# Patient Record
Sex: Female | Born: 1990 | Hispanic: No | Marital: Single | Smoking: Never smoker
Health system: Southern US, Community
[De-identification: ages and names within clinical notes are randomized; demographics above are authoritative.]

---

## 2014-07-23 ENCOUNTER — Emergency Department (HOSPITAL_COMMUNITY)
Admission: EM | Admit: 2014-07-23 | Discharge: 2014-07-24 | Disposition: A | Discharge status: Home / Self Care | Payer: No Typology Code available for payment source | Attending: Emergency Medicine | Admitting: Emergency Medicine

## 2014-07-23 ENCOUNTER — Encounter (HOSPITAL_COMMUNITY): Payer: Self-pay | Admitting: Emergency Medicine

## 2014-07-23 ENCOUNTER — Emergency Department (HOSPITAL_COMMUNITY): Payer: No Typology Code available for payment source

## 2014-07-23 DIAGNOSIS — S299XXA Unspecified injury of thorax, initial encounter: Secondary | ICD-10-CM | POA: Insufficient documentation

## 2014-07-23 DIAGNOSIS — S3991XA Unspecified injury of abdomen, initial encounter: Secondary | ICD-10-CM | POA: Diagnosis present

## 2014-07-23 DIAGNOSIS — S2001XA Contusion of right breast, initial encounter: Secondary | ICD-10-CM | POA: Diagnosis not present

## 2014-07-23 DIAGNOSIS — Y998 Other external cause status: Secondary | ICD-10-CM | POA: Insufficient documentation

## 2014-07-23 DIAGNOSIS — Y9389 Activity, other specified: Secondary | ICD-10-CM | POA: Insufficient documentation

## 2014-07-23 DIAGNOSIS — S30811A Abrasion of abdominal wall, initial encounter: Secondary | ICD-10-CM | POA: Diagnosis not present

## 2014-07-23 DIAGNOSIS — S301XXA Contusion of abdominal wall, initial encounter: Secondary | ICD-10-CM | POA: Insufficient documentation

## 2014-07-23 DIAGNOSIS — Y92410 Unspecified street and highway as the place of occurrence of the external cause: Secondary | ICD-10-CM | POA: Insufficient documentation

## 2014-07-23 LAB — URINE MICROSCOPIC-ADD ON

## 2014-07-23 LAB — URINALYSIS, ROUTINE W REFLEX MICROSCOPIC
BILIRUBIN URINE: NEGATIVE
Glucose, UA: NEGATIVE mg/dL
KETONES UR: NEGATIVE mg/dL
Leukocytes, UA: NEGATIVE
NITRITE: NEGATIVE
Protein, ur: NEGATIVE mg/dL
Specific Gravity, Urine: 1.007 (ref 1.005–1.030)
Urobilinogen, UA: 0.2 mg/dL (ref 0.0–1.0)
pH: 6.5 (ref 5.0–8.0)

## 2014-07-23 LAB — COMPREHENSIVE METABOLIC PANEL
ALT: 21 U/L (ref 0–35)
AST: 16 U/L (ref 0–37)
Albumin: 4 g/dL (ref 3.5–5.2)
Alkaline Phosphatase: 49 U/L (ref 39–117)
Anion gap: 5 (ref 5–15)
BUN: 12 mg/dL (ref 6–23)
CO2: 27 mmol/L (ref 19–32)
CREATININE: 0.63 mg/dL (ref 0.50–1.10)
Calcium: 9.1 mg/dL (ref 8.4–10.5)
Chloride: 108 mmol/L (ref 96–112)
GFR calc non Af Amer: >90 mL/min (ref >90)
Glucose, Bld: 108 mg/dL — ABNORMAL HIGH (ref 70–99)
Potassium: 3.1 mmol/L — ABNORMAL LOW (ref 3.5–5.1)
SODIUM: 140 mmol/L (ref 135–145)
TOTAL PROTEIN: 6.6 g/dL (ref 6.0–8.3)
Total Bilirubin: 0.7 mg/dL (ref 0.3–1.2)

## 2014-07-23 LAB — CBC WITH DIFFERENTIAL/PLATELET
BASOS ABS: 0 10*3/uL (ref 0.0–0.1)
BASOS PCT: 0 % (ref 0–1)
EOS PCT: 1 % (ref 0–5)
Eosinophils Absolute: 0.1 10*3/uL (ref 0.0–0.7)
HEMATOCRIT: 37.5 % (ref 36.0–46.0)
HEMOGLOBIN: 12.2 g/dL (ref 12.0–15.0)
LYMPHS ABS: 1.8 10*3/uL (ref 0.7–4.0)
LYMPHS PCT: 21 % (ref 12–46)
MCH: 26.1 pg (ref 26.0–34.0)
MCHC: 32.5 g/dL (ref 30.0–36.0)
MCV: 80.3 fL (ref 78.0–100.0)
MONOS PCT: 4 % (ref 3–12)
Monocytes Absolute: 0.4 10*3/uL (ref 0.1–1.0)
Neutro Abs: 6.5 10*3/uL (ref 1.7–7.7)
Neutrophils Relative %: 74 % (ref 43–77)
PLATELETS: 244 10*3/uL (ref 150–400)
RBC: 4.67 MIL/uL (ref 3.87–5.11)
RDW: 12.4 % (ref 11.5–15.5)
WBC: 8.8 10*3/uL (ref 4.0–10.5)

## 2014-07-23 LAB — I-STAT BETA HCG BLOOD, ED (MC, WL, AP ONLY): I-stat hCG, quantitative: <5 m[IU]/mL (ref <5)

## 2014-07-23 LAB — LIPASE, BLOOD: LIPASE: 25 U/L (ref 11–59)

## 2014-07-23 MED ORDER — IOHEXOL 300 MG/ML  SOLN
100.0000 mL | Freq: Once | INTRAMUSCULAR | Status: AC | PRN
  Administered 2014-07-23: 100 mL via INTRAVENOUS

## 2014-07-23 MED ORDER — MORPHINE SULFATE 4 MG/ML IJ SOLN
4.0000 mg | Freq: Once | INTRAMUSCULAR | Status: DC
  Filled 2014-07-23: qty 1

## 2014-07-23 MED ORDER — SODIUM CHLORIDE 0.9 % IV BOLUS (SEPSIS)
1000.0000 mL | Freq: Once | INTRAVENOUS | Status: AC
  Administered 2014-07-23: 1000 mL via INTRAVENOUS

## 2014-07-23 MED ORDER — ONDANSETRON HCL 4 MG/2ML IJ SOLN: 4.0000 mg | Freq: Once | INTRAMUSCULAR | Status: DC

## 2014-07-23 MED ORDER — FENTANYL CITRATE 0.05 MG/ML IJ SOLN
50.0000 ug | Freq: Once | INTRAMUSCULAR | Status: AC
  Administered 2014-07-23: 50 ug via INTRAVENOUS
  Filled 2014-07-23: qty 2

## 2014-07-23 MED ORDER — SODIUM CHLORIDE 0.9 % IV BOLUS (SEPSIS): 1000.0000 mL | Freq: Once | INTRAVENOUS | Status: DC

## 2014-07-23 MED ORDER — MORPHINE SULFATE 4 MG/ML IJ SOLN: 4.0000 mg | Freq: Once | INTRAMUSCULAR | Status: DC

## 2014-07-23 NOTE — ED Notes (Signed)
Pt enroute to CT at this time
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
## 2014-07-23 NOTE — ED Notes (Addendum)
Pt aware of urine sample needed, pt unable to go at this time. Pt belongings at bedside, family at bedside.

## 2014-07-23 NOTE — ED Notes (Signed)
Pt monitored by pulse ox, bp cuff, and 12-lead in room 33.

## 2014-07-24 MED ORDER — IBUPROFEN 400 MG PO TABS: 400.0000 mg | ORAL_TABLET | Freq: Four times a day (QID) | ORAL | Status: AC | PRN

## 2014-07-24 MED ORDER — OXYCODONE-ACETAMINOPHEN 5-325 MG PO TABS: 1.0000 | ORAL_TABLET | ORAL | Status: AC | PRN

## 2014-07-24 NOTE — ED Provider Notes (Signed)
Ct imaging neg for acute disease Pt is ambulatory No distress Pt feels well for d/c home   Joya Gaskinsonald W Daianna Vasques, MD 07/24/14 0246
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
## 2014-07-24 NOTE — Discharge Instructions (Signed)
Blunt Abdominal Trauma °A blunt injury to the abdomen can cause pain. The pain is most likely from bruising and stretching of your muscles. This pain is often made worse with movement. Most often these injuries are not serious and get better within 1 week with rest and mild pain medicine. However, internal organs (liver, spleen, kidneys) can be injured with blunt trauma. If you do not get better or if you get worse, further examination may be needed. °Continue with your regular daily activities, but avoid any strenuous activities until your pain is improved. If your stomach is upset, stick to a clear liquid diet and slowly advance to solid food.  °SEEK IMMEDIATE MEDICAL CARE IF:  °· You develop increasing pain, nausea, or repeated vomiting. °· You develop chest pain or breathing difficulty. °· You develop blood in the urine, vomit, or stool. °· You develop weakness, fainting, fever, or other serious complaints. °Document Released: 06/16/2004 Document Revised: 08/01/2011 Document Reviewed: 10/02/2008 °[employer]® Patient Information ©2015 [employer]. This information is not intended to replace advice given to you by your health care provider. Make sure you discuss any questions you have with your health care provider. ° °
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
